# Patient Record
Sex: Female | Born: 1960 | Race: White | Hispanic: No | Marital: Single
Health system: Southern US, Community
[De-identification: ages and names within clinical notes are randomized; demographics above are authoritative.]

---

## 1998-05-30 ENCOUNTER — Observation Stay (HOSPITAL_COMMUNITY): Admission: RE | Admit: 1998-05-30 | Discharge: 1998-05-31 | Payer: Self-pay | Admitting: Gynecology

## 2001-06-16 ENCOUNTER — Other Ambulatory Visit: Admission: RE | Admit: 2001-06-16 | Discharge: 2001-06-16 | Payer: Self-pay | Admitting: Gynecology

## 2008-09-15 ENCOUNTER — Other Ambulatory Visit: Admission: RE | Admit: 2008-09-15 | Discharge: 2008-09-15 | Payer: Self-pay | Admitting: Gynecology

## 2008-09-15 ENCOUNTER — Encounter: Payer: Self-pay | Admitting: Gynecology

## 2008-09-15 ENCOUNTER — Ambulatory Visit: Payer: Self-pay | Admitting: Gynecology

## 2017-07-17 ENCOUNTER — Other Ambulatory Visit: Payer: Self-pay | Admitting: Family Medicine

## 2017-07-17 DIAGNOSIS — Z1231 Encounter for screening mammogram for malignant neoplasm of breast: Secondary | ICD-10-CM

## 2017-08-05 ENCOUNTER — Ambulatory Visit: Payer: Self-pay

## 2017-08-19 ENCOUNTER — Ambulatory Visit
Admission: RE | Admit: 2017-08-19 | Discharge: 2017-08-19 | Disposition: A | Discharge status: Home / Self Care | Payer: PRIVATE HEALTH INSURANCE | Source: Ambulatory Visit | Attending: Family Medicine | Admitting: Family Medicine

## 2017-08-19 ENCOUNTER — Encounter: Payer: Self-pay | Admitting: Radiology

## 2017-08-19 DIAGNOSIS — Z1231 Encounter for screening mammogram for malignant neoplasm of breast: Secondary | ICD-10-CM

## 2017-08-20 ENCOUNTER — Other Ambulatory Visit: Payer: Self-pay | Admitting: Family Medicine

## 2017-08-20 DIAGNOSIS — R928 Other abnormal and inconclusive findings on diagnostic imaging of breast: Secondary | ICD-10-CM

## 2017-08-26 ENCOUNTER — Encounter (INDEPENDENT_AMBULATORY_CARE_PROVIDER_SITE_OTHER): Payer: Self-pay

## 2017-08-26 ENCOUNTER — Ambulatory Visit
Admission: RE | Admit: 2017-08-26 | Discharge: 2017-08-26 | Disposition: A | Discharge status: Home / Self Care | Payer: PRIVATE HEALTH INSURANCE | Source: Ambulatory Visit | Attending: Family Medicine | Admitting: Family Medicine

## 2017-08-26 ENCOUNTER — Other Ambulatory Visit: Payer: Self-pay | Admitting: Family Medicine

## 2017-08-26 DIAGNOSIS — N6002 Solitary cyst of left breast: Principal | ICD-10-CM

## 2017-08-26 DIAGNOSIS — N6001 Solitary cyst of right breast: Secondary | ICD-10-CM

## 2017-08-26 DIAGNOSIS — R928 Other abnormal and inconclusive findings on diagnostic imaging of breast: Secondary | ICD-10-CM

## 2018-02-27 ENCOUNTER — Other Ambulatory Visit: Payer: PRIVATE HEALTH INSURANCE

## 2018-12-07 ENCOUNTER — Other Ambulatory Visit: Payer: Self-pay | Admitting: Family Medicine

## 2018-12-07 DIAGNOSIS — N6320 Unspecified lump in the left breast, unspecified quadrant: Secondary | ICD-10-CM

## 2018-12-07 DIAGNOSIS — N6310 Unspecified lump in the right breast, unspecified quadrant: Secondary | ICD-10-CM

## 2018-12-11 ENCOUNTER — Ambulatory Visit
Admission: RE | Admit: 2018-12-11 | Discharge: 2018-12-11 | Disposition: A | Discharge status: Home / Self Care | Payer: PRIVATE HEALTH INSURANCE | Source: Ambulatory Visit | Attending: Family Medicine | Admitting: Family Medicine

## 2018-12-11 ENCOUNTER — Telehealth: Payer: Self-pay | Admitting: *Deleted

## 2018-12-11 ENCOUNTER — Ambulatory Visit: Payer: PRIVATE HEALTH INSURANCE

## 2018-12-11 DIAGNOSIS — N6320 Unspecified lump in the left breast, unspecified quadrant: Secondary | ICD-10-CM

## 2018-12-11 DIAGNOSIS — N6310 Unspecified lump in the right breast, unspecified quadrant: Secondary | ICD-10-CM

## 2018-12-11 NOTE — Telephone Encounter (Signed)
Telephoned patient, left a message to return a call to [facility]  regarding mammo appointment. 

## 2018-12-17 ENCOUNTER — Other Ambulatory Visit: Payer: Self-pay | Admitting: Family Medicine

## 2018-12-17 ENCOUNTER — Telehealth: Payer: Self-pay | Admitting: *Deleted

## 2018-12-17 DIAGNOSIS — Z720 Tobacco use: Secondary | ICD-10-CM

## 2018-12-17 NOTE — Telephone Encounter (Signed)
Telephoned patient, left a message to return a call to [facility]. 

## 2018-12-22 ENCOUNTER — Ambulatory Visit
Admission: RE | Admit: 2018-12-22 | Discharge: 2018-12-22 | Disposition: A | Discharge status: Home / Self Care | Payer: No Typology Code available for payment source | Source: Ambulatory Visit | Attending: Family Medicine | Admitting: Family Medicine

## 2018-12-22 ENCOUNTER — Other Ambulatory Visit: Payer: Self-pay | Admitting: Family Medicine

## 2018-12-22 DIAGNOSIS — Z720 Tobacco use: Secondary | ICD-10-CM

## 2019-12-20 IMAGING — CT CT CHEST W/O CM
1 series · 15 of 34 positions shown, 19 images · non-contrast
Comparison: None.

CLINICAL DATA: Smoker.  Pain under right breast for 2 weeks.

EXAM:
CT CHEST WITHOUT CONTRAST
TECHNIQUE: Multidetector CT imaging of the chest was performed following the
standard protocol without IV contrast.

[Series 2: chest w/(date) · axial · 0.76mm/px · z∈[-329,-43]mm · 15 of 169 slices shown, 19 images]
[im 13/169  mediastinal]
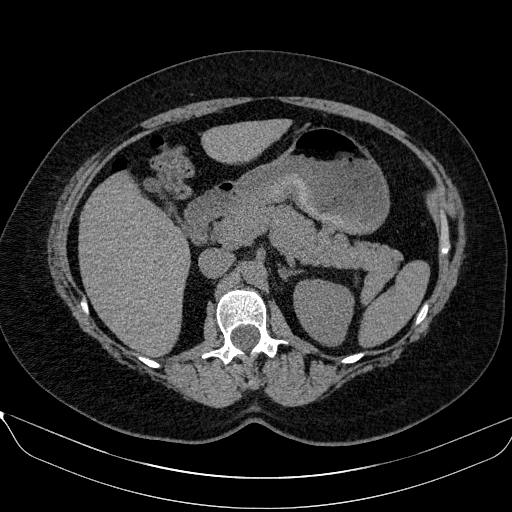
[im 13/169  lung]
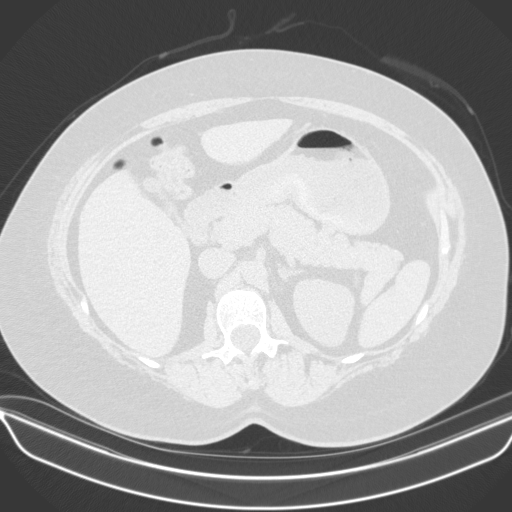
[im 25/169  lung]
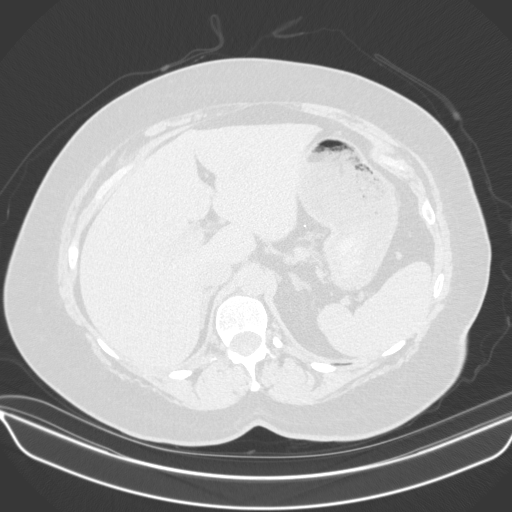
[im 34/169  lung]
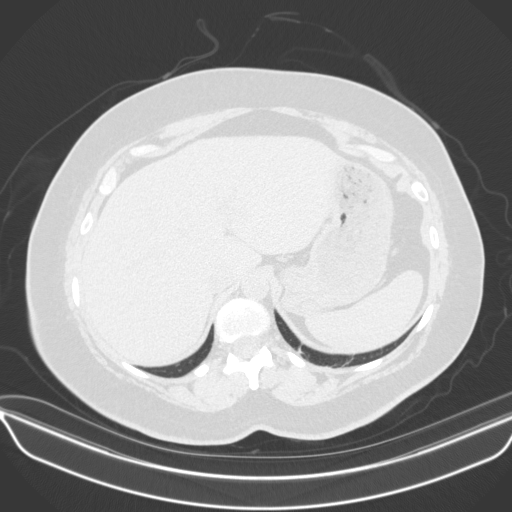
[im 44/169  lung]
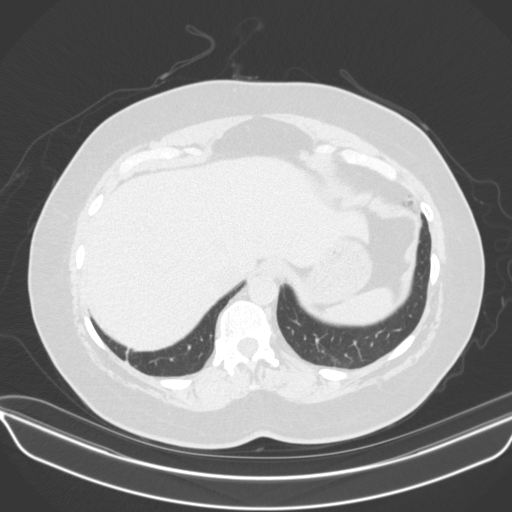
[im 57/169  mediastinal]
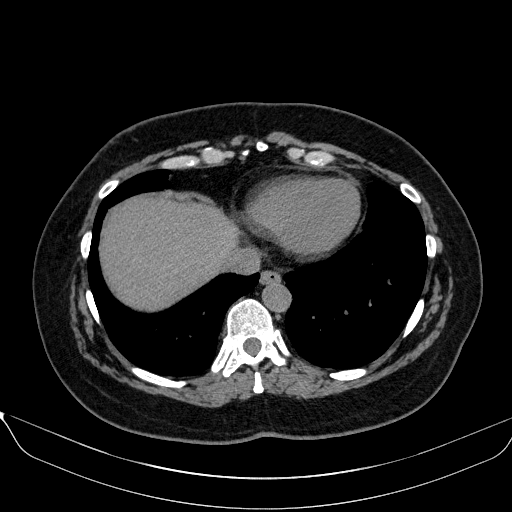
[im 57/169  lung]
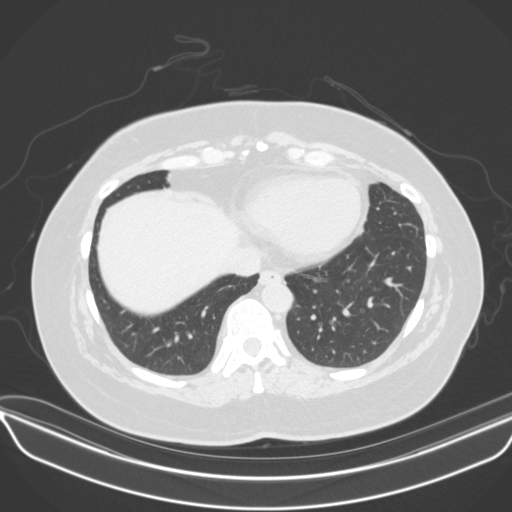
[im 68/169  lung]
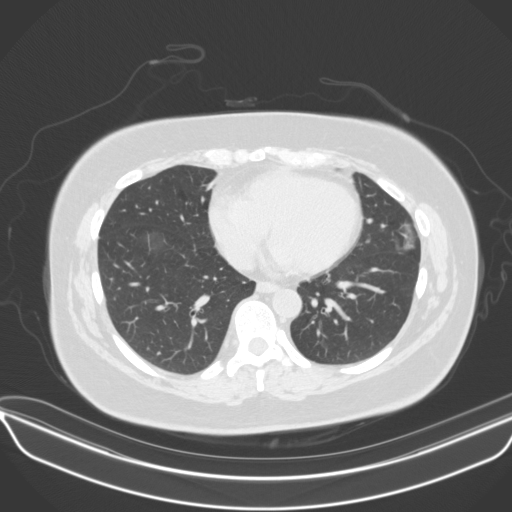
[im 75/169  lung]
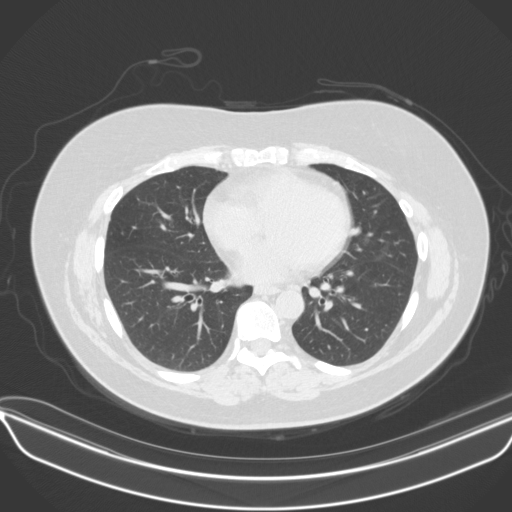
[im 88/169  lung]
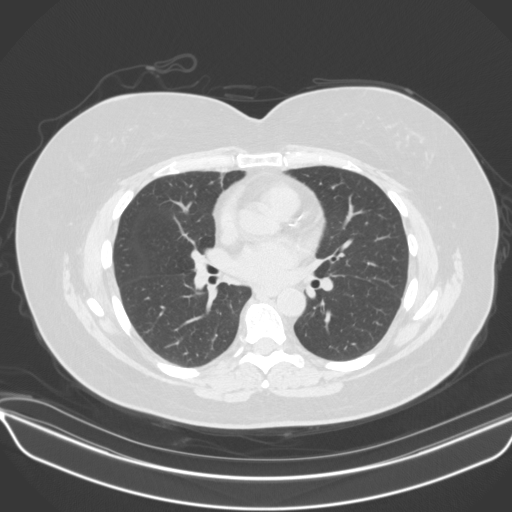
[im 94/169  mediastinal]
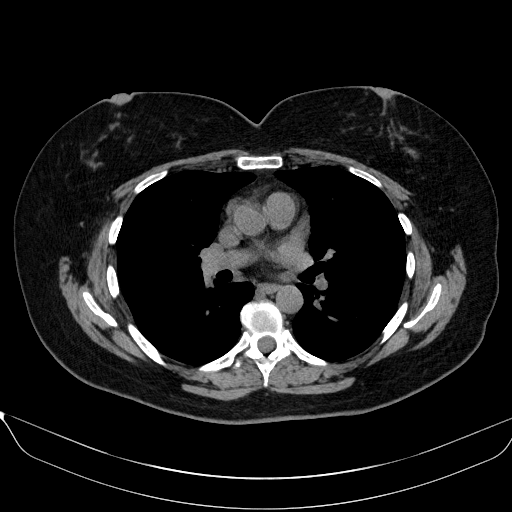
[im 94/169  lung]
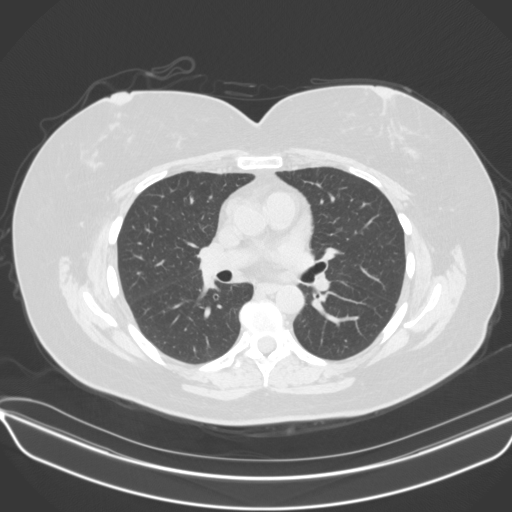
[im 101/169  lung]
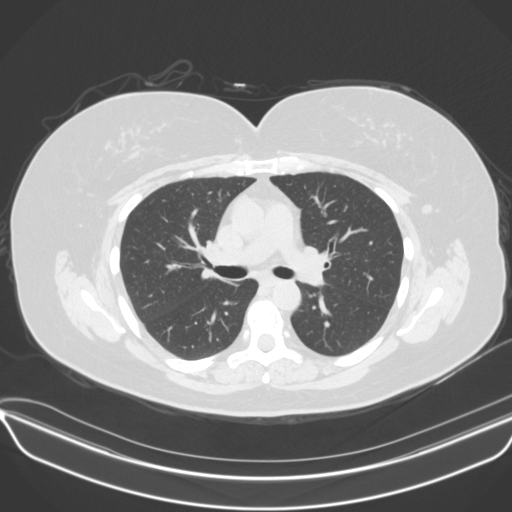
[im 113/169  lung]
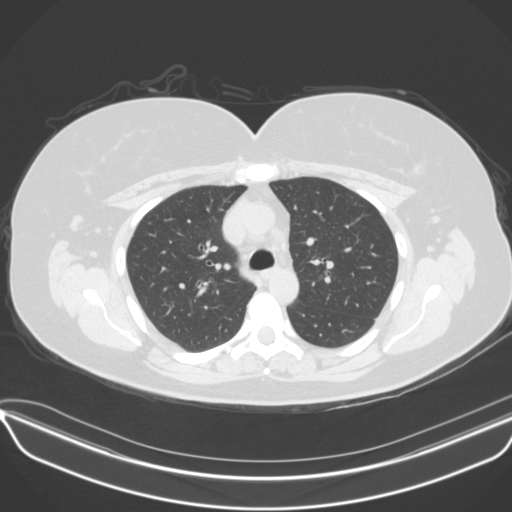
[im 125/169  lung]
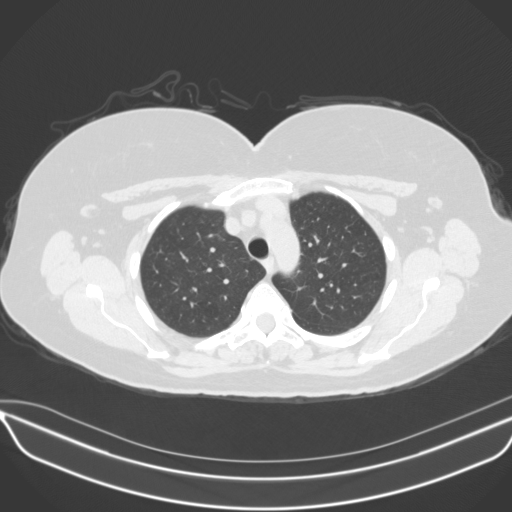
[im 135/169  mediastinal]
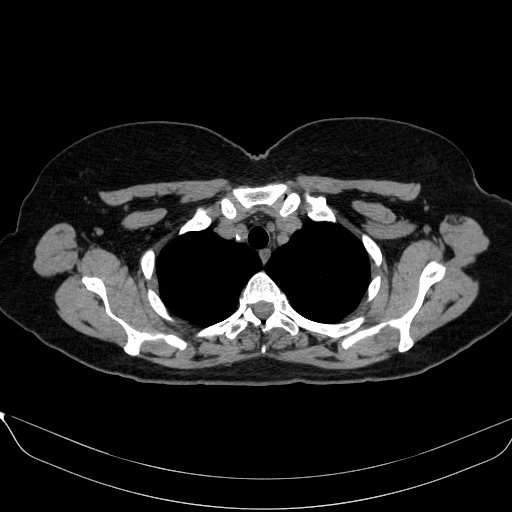
[im 135/169  lung]
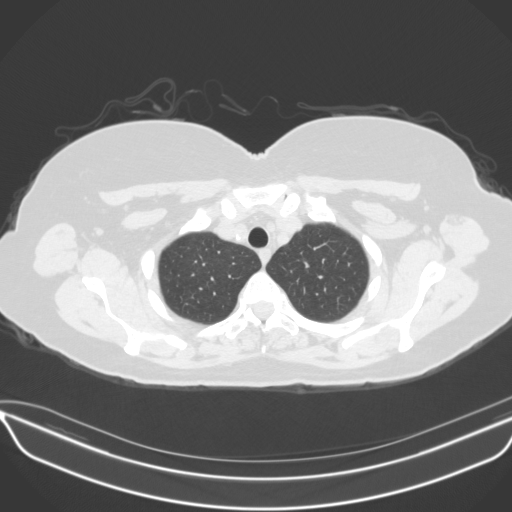
[im 144/169  lung]
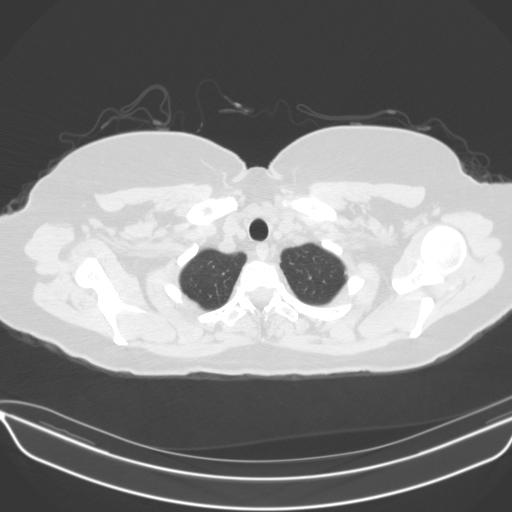
[im 156/169  lung]
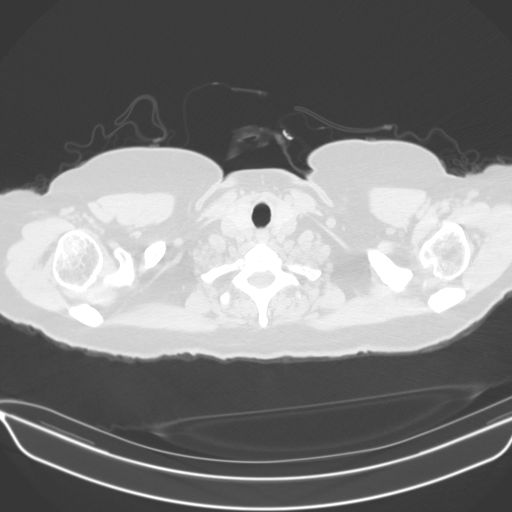

[15 of 34 positions shown; findings below may reference images not displayed]

FINDINGS: Cardiovascular: Normal heart size. Calcifications in the LAD, left
circumflex coronary arteries identified. No pericardial effusion.

Mediastinum/Nodes: No enlarged axillary, supraclavicular,
mediastinal lymph nodes. Normal appearance of the thyroid gland. The
trachea appears patent and is midline. Normal appearance of the
esophagus.

Lungs/Pleura: No pleural effusion. Small nonspecific nodule in the
posteromedial left upper lobe measures 3 mm, image 54/5. In the
right middle lobe there is a 3 mm nodule, image 105/5.

Upper Abdomen: No acute abnormality.

Musculoskeletal: Thoracic scoliosis identified. No chest wall mass
or suspicious bone lesions.
IMPRESSION: 1. Small, nonspecific nodules noted in the right middle lobe and
left upper lobe. No follow-up needed if patient is low-risk (and has
no known or suspected primary neoplasm). Non-contrast chest CT can
be considered in 12 months if patient is high-risk. This
recommendation follows the consensus statement: Guidelines for
Management of Incidental Pulmonary Nodules Detected on CT Images:
2. Aortic atherosclerosis and coronary artery atherosclerotic
calcifications

Aortic Atherosclerosis ([id number]).

## 2020-01-07 ENCOUNTER — Ambulatory Visit: Payer: Self-pay | Attending: Internal Medicine

## 2020-01-07 DIAGNOSIS — Z23 Encounter for immunization: Secondary | ICD-10-CM

## 2020-01-07 NOTE — Progress Notes (Signed)
   Covid-19 Vaccination Clinic  Name:  Brittany Lowery    MRN: [id number] DOB: 05-31-1961  01/07/2020  [name] was observed post Covid-19 immunization for 15 minutes without incident. She was provided with Vaccine Information Sheet and instruction to access the V-Safe system.   [name] was instructed to call 911 with any severe reactions post vaccine: Marland Kitchen Difficulty breathing  . Swelling of face and throat  . A fast heartbeat  . A bad rash all over body  . Dizziness and weakness   Immunizations Administered    Name Date Dose VIS Date Route   Moderna COVID-19 Vaccine 01/07/2020 10:09 AM 0.5 mL 09/28/2019 Intramuscular   Manufacturer: Moderna   Lot: [id number]   NDC: 11552-080-22

## 2020-02-08 ENCOUNTER — Ambulatory Visit: Payer: Self-pay | Attending: Internal Medicine

## 2020-02-08 DIAGNOSIS — Z23 Encounter for immunization: Secondary | ICD-10-CM

## 2020-02-08 NOTE — Progress Notes (Signed)
   Covid-19 Vaccination Clinic  Name:  Brittany Lowery    MRN: [id number] DOB: [birth year]  02/08/2020  Ms. Brittany Lowery was observed post Covid-19 immunization for 15 minutes without incident. She was provided with Vaccine Information Sheet and instruction to access the V-Safe system.   Ms. Brittany Lowery was instructed to call 911 with any severe reactions post vaccine: Marland Kitchen Difficulty breathing  . Swelling of face and throat  . A fast heartbeat  . A bad rash all over body  . Dizziness and weakness   Immunizations Administered    Name Date Dose VIS Date Route   Moderna COVID-19 Vaccine 02/08/2020 11:08 AM 0.5 mL 09/28/2019 Intramuscular   Manufacturer: Moderna   Lot: [id number]   NDC: 71252-712-92
